# Patient Record
Sex: Female | Born: 1960 | Race: White | Hispanic: No | Marital: Married | Smoking: Never smoker
Health system: Southern US, Community
[De-identification: ages and names within clinical notes are randomized; demographics above are authoritative.]

## PROBLEM LIST (undated history)

## (undated) DIAGNOSIS — J329 Chronic sinusitis, unspecified: Secondary | ICD-10-CM

## (undated) DIAGNOSIS — K219 Gastro-esophageal reflux disease without esophagitis: Secondary | ICD-10-CM

## (undated) DIAGNOSIS — R569 Unspecified convulsions: Secondary | ICD-10-CM

## (undated) DIAGNOSIS — E559 Vitamin D deficiency, unspecified: Secondary | ICD-10-CM

## (undated) DIAGNOSIS — M5137 Other intervertebral disc degeneration, lumbosacral region: Secondary | ICD-10-CM

## (undated) DIAGNOSIS — D649 Anemia, unspecified: Secondary | ICD-10-CM

## (undated) DIAGNOSIS — I10 Essential (primary) hypertension: Secondary | ICD-10-CM

## (undated) DIAGNOSIS — N83299 Other ovarian cyst, unspecified side: Secondary | ICD-10-CM

## (undated) DIAGNOSIS — M51379 Other intervertebral disc degeneration, lumbosacral region without mention of lumbar back pain or lower extremity pain: Secondary | ICD-10-CM

## (undated) HISTORY — PX: COLONOSCOPY: SHX174

---

## 2004-09-25 ENCOUNTER — Ambulatory Visit: Payer: Self-pay

## 2005-11-07 ENCOUNTER — Ambulatory Visit: Payer: Self-pay

## 2008-08-04 ENCOUNTER — Ambulatory Visit: Payer: Self-pay

## 2010-10-06 ENCOUNTER — Ambulatory Visit: Payer: Self-pay | Admitting: Internal Medicine

## 2011-10-15 ENCOUNTER — Ambulatory Visit: Payer: Self-pay

## 2013-12-10 ENCOUNTER — Telehealth: Payer: Self-pay

## 2014-01-13 ENCOUNTER — Other Ambulatory Visit: Payer: Self-pay

## 2015-07-05 ENCOUNTER — Encounter: Payer: Self-pay | Admitting: *Deleted

## 2015-07-06 ENCOUNTER — Encounter: Payer: Self-pay | Admitting: Anesthesiology

## 2015-07-06 ENCOUNTER — Ambulatory Visit: Payer: BC Managed Care – PPO | Admitting: Anesthesiology

## 2015-07-06 ENCOUNTER — Ambulatory Visit
Admission: RE | Admit: 2015-07-06 | Discharge: 2015-07-06 | Disposition: A | Discharge status: Home / Self Care | Payer: BC Managed Care – PPO | Source: Ambulatory Visit | Attending: Unknown Physician Specialty | Admitting: Unknown Physician Specialty

## 2015-07-06 ENCOUNTER — Encounter
Admission: RE | Disposition: A | Discharge status: Home / Self Care | Payer: Self-pay | Source: Ambulatory Visit | Attending: Unknown Physician Specialty

## 2015-07-06 DIAGNOSIS — I10 Essential (primary) hypertension: Secondary | ICD-10-CM | POA: Diagnosis not present

## 2015-07-06 DIAGNOSIS — E559 Vitamin D deficiency, unspecified: Secondary | ICD-10-CM | POA: Diagnosis not present

## 2015-07-06 DIAGNOSIS — D649 Anemia, unspecified: Secondary | ICD-10-CM | POA: Diagnosis not present

## 2015-07-06 DIAGNOSIS — K640 First degree hemorrhoids: Secondary | ICD-10-CM | POA: Diagnosis not present

## 2015-07-06 DIAGNOSIS — Z79899 Other long term (current) drug therapy: Secondary | ICD-10-CM | POA: Insufficient documentation

## 2015-07-06 DIAGNOSIS — J329 Chronic sinusitis, unspecified: Secondary | ICD-10-CM | POA: Diagnosis not present

## 2015-07-06 DIAGNOSIS — Z1211 Encounter for screening for malignant neoplasm of colon: Secondary | ICD-10-CM | POA: Insufficient documentation

## 2015-07-06 DIAGNOSIS — R569 Unspecified convulsions: Secondary | ICD-10-CM | POA: Diagnosis not present

## 2015-07-06 DIAGNOSIS — K219 Gastro-esophageal reflux disease without esophagitis: Secondary | ICD-10-CM | POA: Insufficient documentation

## 2015-07-06 DIAGNOSIS — Z8371 Family history of colonic polyps: Secondary | ICD-10-CM | POA: Insufficient documentation

## 2015-07-06 DIAGNOSIS — M5137 Other intervertebral disc degeneration, lumbosacral region: Secondary | ICD-10-CM | POA: Insufficient documentation

## 2015-07-06 DIAGNOSIS — M1990 Unspecified osteoarthritis, unspecified site: Secondary | ICD-10-CM | POA: Diagnosis not present

## 2015-07-06 HISTORY — DX: Other intervertebral disc degeneration, lumbosacral region: M51.37

## 2015-07-06 HISTORY — DX: Essential (primary) hypertension: I10

## 2015-07-06 HISTORY — DX: Chronic sinusitis, unspecified: J32.9

## 2015-07-06 HISTORY — DX: Vitamin D deficiency, unspecified: E55.9

## 2015-07-06 HISTORY — DX: Other ovarian cyst, unspecified side: N83.299

## 2015-07-06 HISTORY — PX: COLONOSCOPY WITH PROPOFOL: SHX5780

## 2015-07-06 HISTORY — DX: Anemia, unspecified: D64.9

## 2015-07-06 HISTORY — DX: Unspecified convulsions: R56.9

## 2015-07-06 HISTORY — DX: Other intervertebral disc degeneration, lumbosacral region without mention of lumbar back pain or lower extremity pain: M51.379

## 2015-07-06 HISTORY — DX: Gastro-esophageal reflux disease without esophagitis: K21.9

## 2015-07-06 SURGERY — COLONOSCOPY WITH PROPOFOL
Anesthesia: General

## 2015-07-06 MED ORDER — FENTANYL CITRATE (PF) 100 MCG/2ML IJ SOLN
INTRAMUSCULAR | Status: DC | PRN
  Administered 2015-07-06: 50 ug via INTRAVENOUS

## 2015-07-06 MED ORDER — DEXAMETHASONE SODIUM PHOSPHATE 10 MG/ML IJ SOLN
INTRAMUSCULAR | Status: DC | PRN
Start: 2015-07-06 — End: 2015-07-06
  Administered 2015-07-06: 4 mg via INTRAVENOUS

## 2015-07-06 MED ORDER — SODIUM CHLORIDE 0.9 % IV SOLN: INTRAVENOUS | Status: DC

## 2015-07-06 MED ORDER — METOCLOPRAMIDE HCL 5 MG/ML IJ SOLN
INTRAMUSCULAR | Status: DC | PRN
  Administered 2015-07-06: 10 mg via INTRAVENOUS

## 2015-07-06 MED ORDER — MIDAZOLAM HCL 2 MG/2ML IJ SOLN
INTRAMUSCULAR | Status: DC | PRN
  Administered 2015-07-06: 2 mg via INTRAVENOUS

## 2015-07-06 MED ORDER — ONDANSETRON HCL 4 MG/2ML IJ SOLN
INTRAMUSCULAR | Status: DC | PRN
  Administered 2015-07-06: 4 mg via INTRAVENOUS

## 2015-07-06 MED ORDER — SODIUM CHLORIDE 0.9 % IV SOLN
INTRAVENOUS | Status: DC
  Administered 2015-07-06: 1000 mL via INTRAVENOUS

## 2015-07-06 MED ORDER — PROPOFOL INFUSION 10 MG/ML OPTIME
INTRAVENOUS | Status: DC | PRN
  Administered 2015-07-06: 120 ug/kg/min via INTRAVENOUS

## 2015-07-06 NOTE — Op Note (Signed)
[facility] Patient Name: Sierra Smith Procedure Date: 07/06/2015 9:55 AM MRN: [id number] Account #: [id number] Date of Birth: [birth year] Admit Type: Outpatient Age: 54 Room: [facility] ENDO ROOM 1 Gender: Female Note Status: Finalized Procedure:         Colonoscopy Indications:       Colon cancer screening in patient at increased risk:                     Family history of colon polyps Providers:         Scot Jun, MD Referring MD:      Daniel Nones, MD (Referring MD) Medicines:         Propofol per Anesthesia Complications:     No immediate complications. Procedure:         Pre-Anesthesia Assessment:                    - After reviewing the risks and benefits, the patient was                     deemed in satisfactory condition to undergo the procedure.                    After obtaining informed consent, the colonoscope was                     passed under direct vision. Throughout the procedure, the                     patient's blood pressure, pulse, and oxygen saturations                     were monitored continuously. The Colonoscope was                     introduced through the anus and advanced to the the cecum,                     identified by appendiceal orifice and ileocecal valve. The                     colonoscopy was performed without difficulty. The patient                     tolerated the procedure well. The quality of the bowel                     preparation was excellent. Findings:      Internal hemorrhoids were found during endoscopy. The hemorrhoids were       small, medium-sized and Grade I (internal hemorrhoids that do not       prolapse).      The exam was otherwise without abnormality. Impression:        - Internal hemorrhoids.                    - The examination was otherwise normal.                    - No specimens collected. Recommendation:    - Repeat colonoscopy in 5 years for screening purposes.         - The findings and recommendations were discussed with the  patient's family. Scot Jun, MD 07/06/2015 10:17:17 AM This report has been signed electronically. Number of Addenda: 0 Note Initiated On: 07/06/2015 9:55 AM Scope Withdrawal Time: 0 hours 9 minutes 41 seconds  Total Procedure Duration: 0 hours 14 minutes 57 seconds       [facility]

## 2015-07-06 NOTE — Transfer of Care (Signed)
Immediate Anesthesia Transfer of Care Note  Patient: Sierra Smith  Procedure(s) Performed: Procedure(s): COLONOSCOPY WITH PROPOFOL (N/A)  Patient Location: PACU  Anesthesia Type:General  Level of Consciousness: awake and sedated  Airway & Oxygen Therapy: Patient Spontanous Breathing and Patient connected to nasal cannula oxygen  Post-op Assessment: Report given to RN and Post -op Vital signs reviewed and stable  Post vital signs: Reviewed and stable  Last Vitals:  Filed Vitals:   07/06/15 0906  BP: 168/88  Pulse: 70  Temp: 36.4 C  Resp: 20    Complications: No apparent anesthesia complications

## 2015-07-06 NOTE — H&P (Signed)
   Primary Care Physician:  Lynnea Ferrier, MD Primary Gastroenterologist:  Dr. Mechele Collin  Pre-Procedure History & Physical: HPI:  Sierra Smith is a 54 y.o. female is here for an colonoscopy.   Past Medical History  Diagnosis Date  . Hypertension   . Seizures   . GERD (gastroesophageal reflux disease)   . Functional ovarian cysts   . Anemia   . Recurrent sinusitis   . Vitamin D deficiency   . DDD (degenerative disc disease), lumbosacral     Past Surgical History  Procedure Laterality Date  . Cesarean section N/A     x2  . Colonoscopy      Prior to Admission medications   Medication Sig Start Date End Date Taking? Authorizing Provider  cyclobenzaprine (FLEXERIL) 5 MG tablet Take 5 mg by mouth 3 (three) times daily as needed for muscle spasms.   Yes Historical Provider, MD  hydrochlorothiazide (HYDRODIURIL) 25 MG tablet Take 25 mg by mouth daily.   Yes Historical Provider, MD  HYDROcodone-acetaminophen (NORCO/VICODIN) 5-325 MG per tablet Take 1 tablet by mouth every 6 (six) hours as needed for moderate pain.   Yes Historical Provider, MD  losartan (COZAAR) 100 MG tablet Take 100 mg by mouth daily.   Yes Historical Provider, MD  metoprolol succinate (TOPROL-XL) 25 MG 24 hr tablet Take 25 mg by mouth daily.   Yes Historical Provider, MD  omeprazole (PRILOSEC) 20 MG capsule Take 20 mg by mouth daily.   Yes Historical Provider, MD    Allergies as of 05/17/2015  . (Not on File)    History reviewed. No pertinent family history.  Social History   Social History  . Marital Status: Married    Spouse Name: N/A  . Number of Children: N/A  . Years of Education: N/A   Occupational History  . Not on file.   Social History Main Topics  . Smoking status: Not on file  . Smokeless tobacco: Not on file  . Alcohol Use: Not on file  . Drug Use: Not on file  . Sexual Activity: Not on file   Other Topics Concern  . Not on file   Social History Narrative    Review of  Systems: See HPI, otherwise negative ROS  Physical Exam: BP 168/88 mmHg  Pulse 70  Temp(Src) 97.6 F (36.4 C) (Tympanic)  Resp 20  Ht  (1.702 m)  Wt 77.111 kg (170 lb)  BMI 26.62 kg/m2  SpO2 100% General:   Alert,  pleasant and cooperative in NAD Head:  Normocephalic and atraumatic. Neck:  Supple; no masses or thyromegaly. Lungs:  Clear throughout to auscultation.    Heart:  Regular rate and rhythm. Abdomen:  Soft, nontender and nondistended. Normal bowel sounds, without guarding, and without rebound.   Neurologic:  Alert and  oriented x4;  grossly normal neurologically.  Impression/Plan: Sierra Smith is here for an colonoscopy to be performed for screening family history of colon polyps  Risks, benefits, limitations, and alternatives regarding  colonoscopy have been reviewed with the patient.  Questions have been answered.  All parties agreeable.   Lynnae Prude, MD  07/06/2015, 9:53 AM

## 2015-07-06 NOTE — Anesthesia Postprocedure Evaluation (Signed)
  Anesthesia Post-op Note  Patient: Sierra Smith  Procedure(s) Performed: Procedure(s): COLONOSCOPY WITH PROPOFOL (N/A)  Anesthesia type:General  Patient location: PACU  Post pain: Pain level controlled  Post assessment: Post-op Vital signs reviewed, Patient's Cardiovascular Status Stable, Respiratory Function Stable, Patent Airway and No signs of Nausea or vomiting  Post vital signs: Reviewed and stable  Last Vitals:  Filed Vitals:   07/06/15 1052  BP: 129/79  Pulse: 56  Temp:   Resp: 17    Level of consciousness: awake, alert  and patient cooperative  Complications: No apparent anesthesia complications

## 2015-07-06 NOTE — Anesthesia Preprocedure Evaluation (Signed)
Anesthesia Evaluation  Patient identified by MRN, date of birth, ID band Patient awake    Reviewed: Allergy & Precautions, NPO status , Patient's Chart, lab work & pertinent test results, reviewed documented beta blocker date and time   Airway Mallampati: II  TM Distance: >3 FB     Dental  (+) Chipped   Pulmonary          Cardiovascular hypertension, Pt. on medications and Pt. on home beta blockers     Neuro/Psych Seizures -,     GI/Hepatic GERD-  ,  Endo/Other    Renal/GU      Musculoskeletal  (+) Arthritis -,   Abdominal   Peds  Hematology  (+) anemia ,   Anesthesia Other Findings   Reproductive/Obstetrics                             Anesthesia Physical Anesthesia Plan  ASA: III  Anesthesia Plan: General   Post-op Pain Management:    Induction: Intravenous  Airway Management Planned: Nasal Cannula  Additional Equipment:   Intra-op Plan:   Post-operative Plan:   Informed Consent: I have reviewed the patients History and Physical, chart, labs and discussed the procedure including the risks, benefits and alternatives for the proposed anesthesia with the patient or authorized representative who has indicated his/her understanding and acceptance.     Plan Discussed with: CRNA  Anesthesia Plan Comments:         Anesthesia Quick Evaluation

## 2015-07-07 ENCOUNTER — Encounter: Payer: Self-pay | Admitting: Unknown Physician Specialty

## 2016-08-19 ENCOUNTER — Other Ambulatory Visit: Payer: Self-pay | Admitting: Internal Medicine

## 2016-08-19 DIAGNOSIS — Z1231 Encounter for screening mammogram for malignant neoplasm of breast: Secondary | ICD-10-CM

## 2016-10-16 ENCOUNTER — Ambulatory Visit
Admission: RE | Admit: 2016-10-16 | Discharge: 2016-10-16 | Disposition: A | Discharge status: Home / Self Care | Payer: BC Managed Care – PPO | Source: Ambulatory Visit | Attending: Internal Medicine | Admitting: Internal Medicine

## 2016-10-16 DIAGNOSIS — Z1231 Encounter for screening mammogram for malignant neoplasm of breast: Secondary | ICD-10-CM | POA: Diagnosis present

## 2018-05-29 ENCOUNTER — Other Ambulatory Visit: Payer: Self-pay | Admitting: Internal Medicine

## 2018-05-29 DIAGNOSIS — Z1231 Encounter for screening mammogram for malignant neoplasm of breast: Secondary | ICD-10-CM

## 2018-06-15 ENCOUNTER — Ambulatory Visit
Admission: RE | Admit: 2018-06-15 | Discharge: 2018-06-15 | Disposition: A | Discharge status: Home / Self Care | Payer: BC Managed Care – PPO | Source: Ambulatory Visit | Attending: Internal Medicine | Admitting: Internal Medicine

## 2018-06-15 DIAGNOSIS — Z1231 Encounter for screening mammogram for malignant neoplasm of breast: Secondary | ICD-10-CM

## 2019-06-02 ENCOUNTER — Other Ambulatory Visit: Payer: Self-pay | Admitting: Internal Medicine

## 2019-06-02 DIAGNOSIS — Z1231 Encounter for screening mammogram for malignant neoplasm of breast: Secondary | ICD-10-CM

## 2019-07-08 ENCOUNTER — Ambulatory Visit
Admission: RE | Admit: 2019-07-08 | Discharge: 2019-07-08 | Disposition: A | Discharge status: Home / Self Care | Payer: BC Managed Care – PPO | Source: Ambulatory Visit | Attending: Internal Medicine | Admitting: Internal Medicine

## 2019-07-08 DIAGNOSIS — Z1231 Encounter for screening mammogram for malignant neoplasm of breast: Secondary | ICD-10-CM | POA: Diagnosis present

## 2020-06-08 IMAGING — MG DIGITAL SCREENING BILATERAL MAMMOGRAM WITH TOMO AND CAD
8 series · 8 of 24 positions shown · non-contrast
Comparison: Previous exam(s).

CLINICAL DATA: Screening.

EXAM:
DIGITAL SCREENING BILATERAL MAMMOGRAM WITH TOMO AND CAD

[R MLO synth-2D]
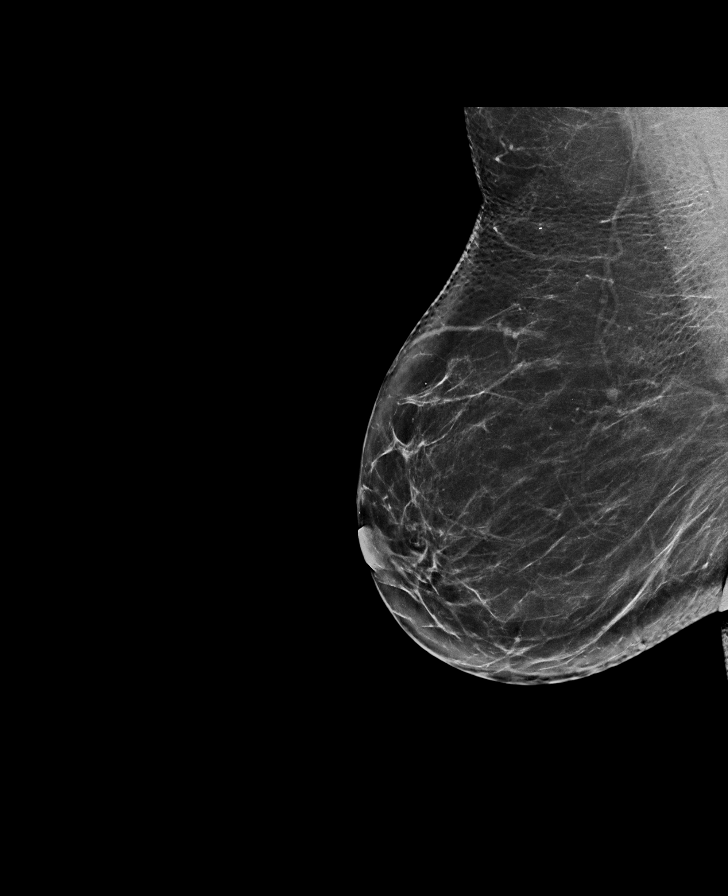

[R CC synth-2D]
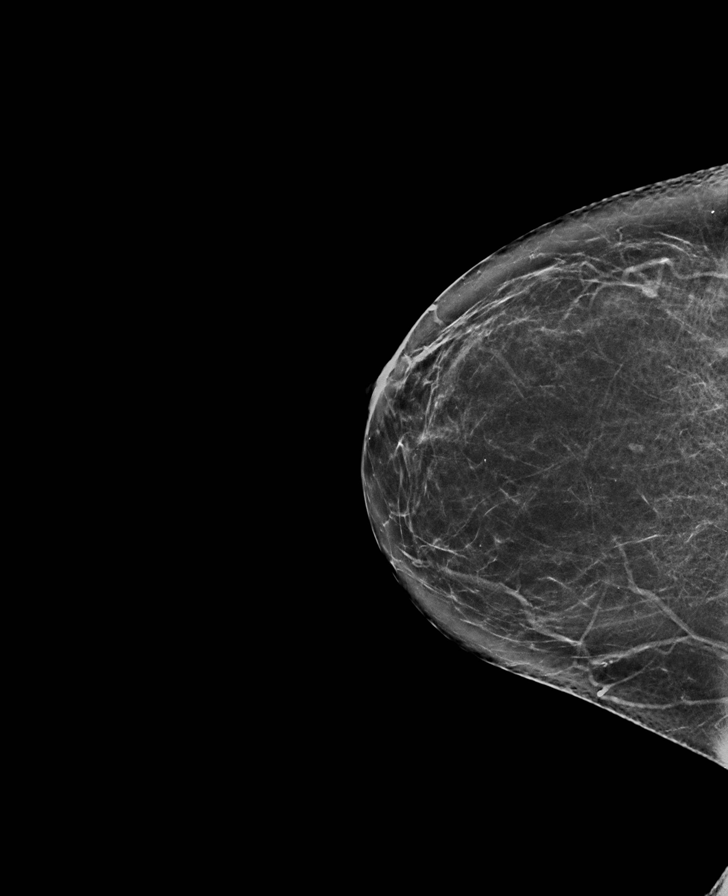

[L MLO synth-2D]
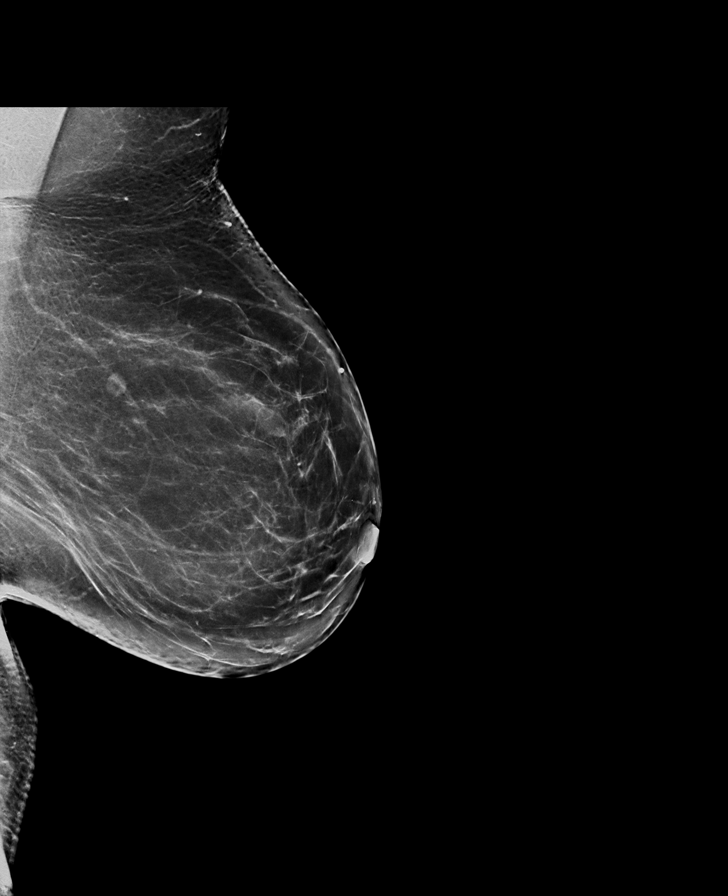

[L CC synth-2D]
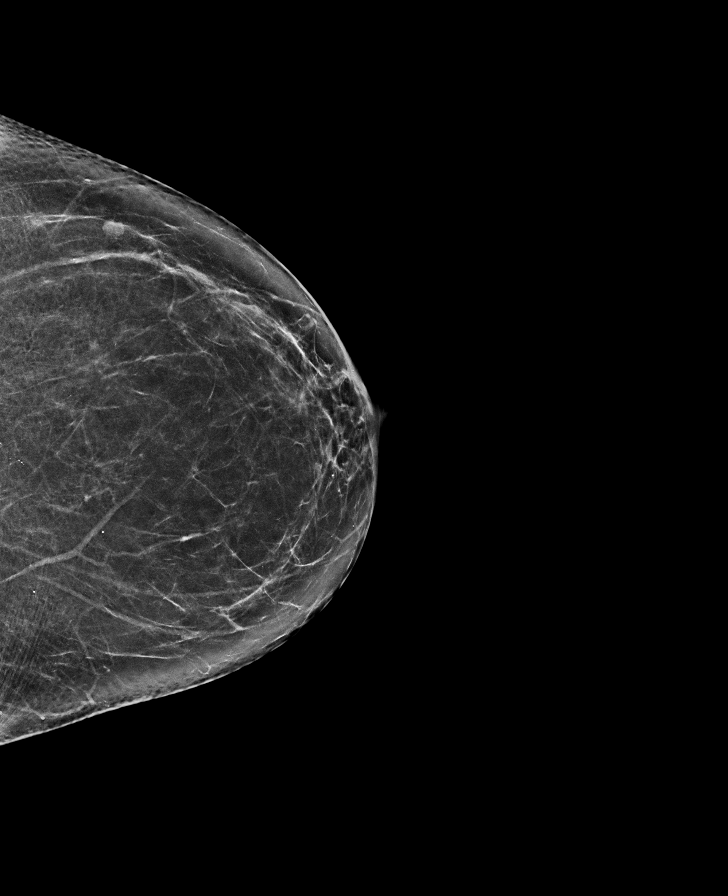

[L CC tomo · tomo slice 35/70.0]
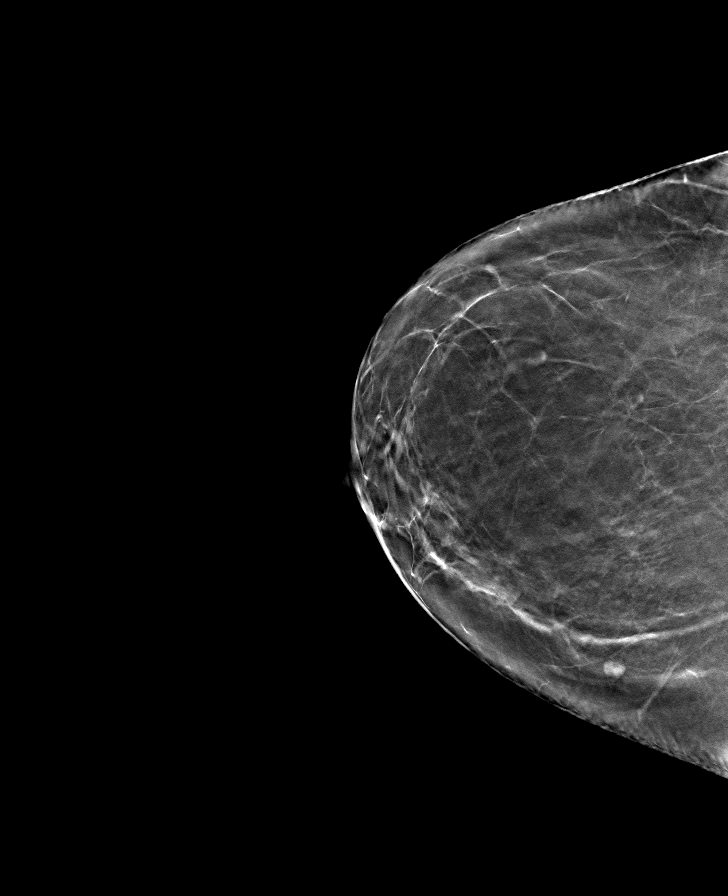

[R CC tomo · tomo slice 35/70.0]
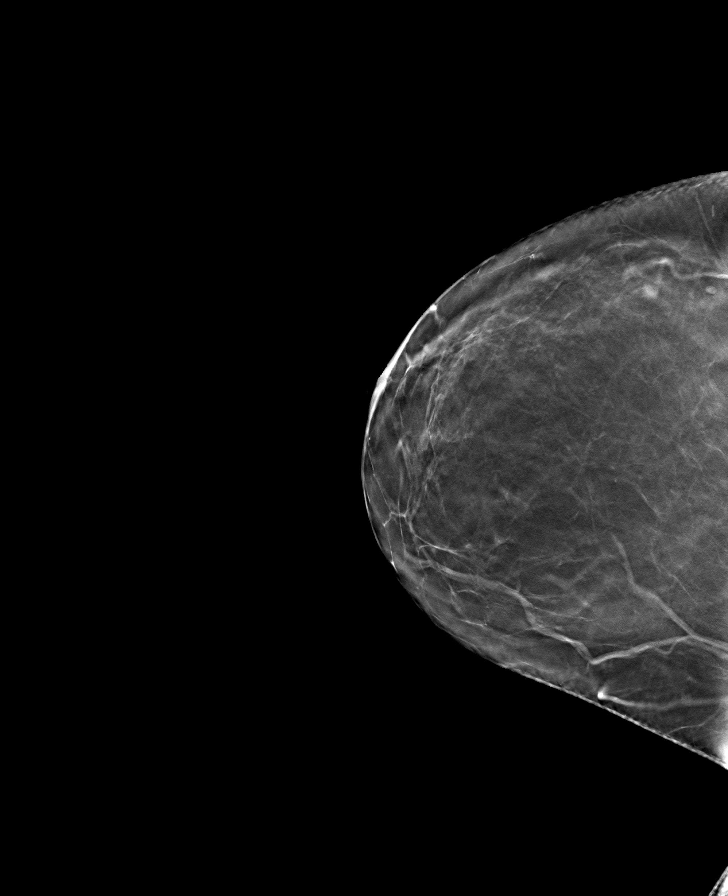

[R MLO tomo · tomo slice 40/79.0]
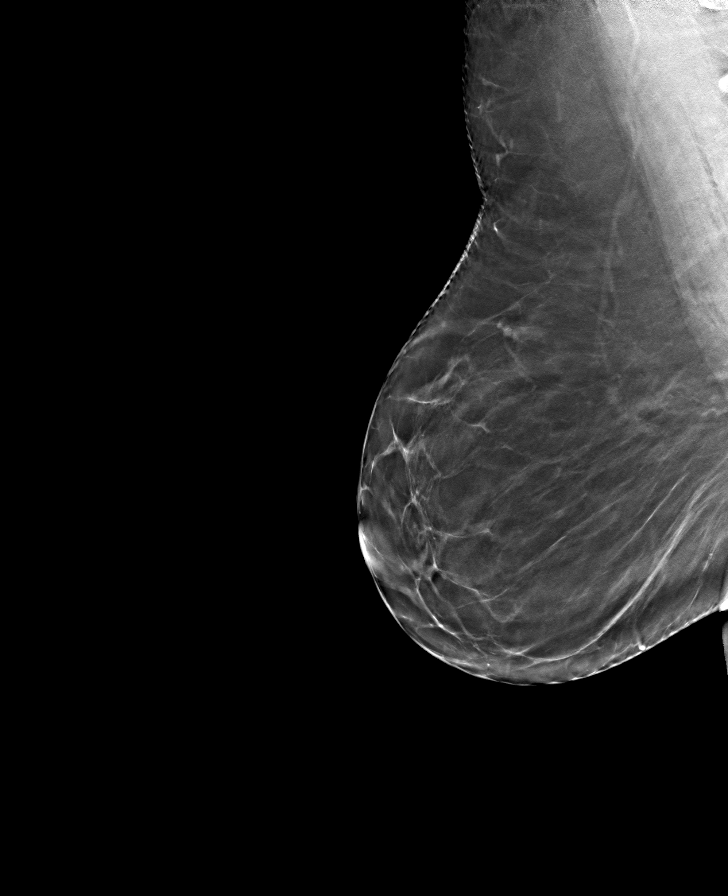

[L MLO tomo · tomo slice 46/91.0]
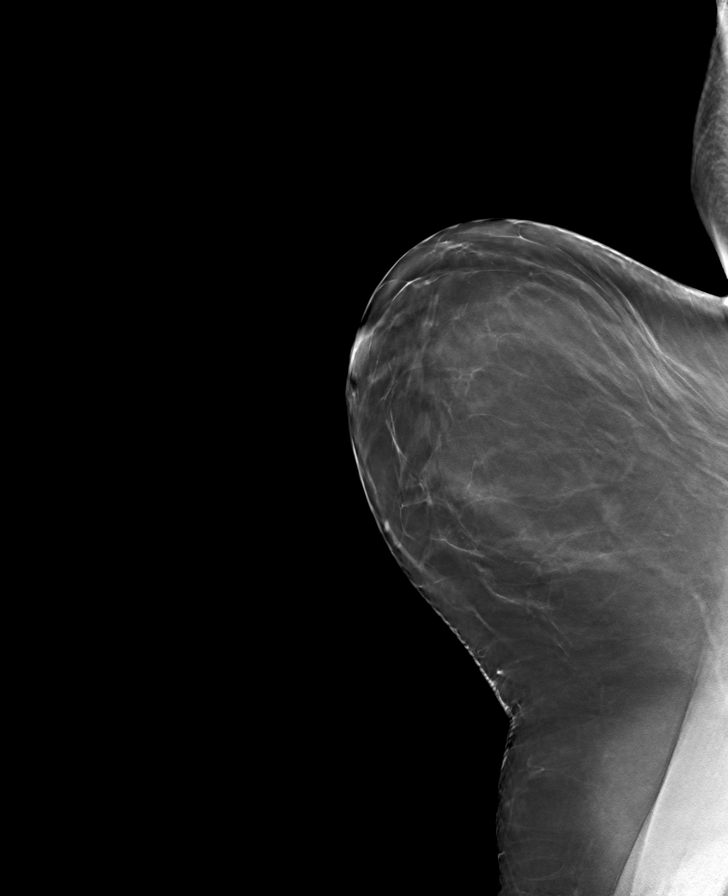

[8 of 24 positions shown; findings below may reference images not displayed]

ACR Breast Density Category b: There are scattered areas of
fibroglandular density.
FINDINGS: There are no findings suspicious for malignancy. Images were
processed with CAD.
IMPRESSION: No mammographic evidence of malignancy. A result letter of this
screening mammogram will be mailed directly to the patient.

RECOMMENDATION:
Screening mammogram in one year. (Code:[id number])

BI-RADS CATEGORY  1: Negative.

## 2020-06-28 ENCOUNTER — Other Ambulatory Visit: Payer: Self-pay | Admitting: Internal Medicine

## 2020-06-28 DIAGNOSIS — Z1231 Encounter for screening mammogram for malignant neoplasm of breast: Secondary | ICD-10-CM

## 2020-07-18 ENCOUNTER — Other Ambulatory Visit: Payer: Self-pay

## 2020-07-18 ENCOUNTER — Ambulatory Visit
Admission: RE | Admit: 2020-07-18 | Discharge: 2020-07-18 | Disposition: A | Discharge status: Home / Self Care | Payer: BC Managed Care – PPO | Source: Ambulatory Visit | Attending: Internal Medicine | Admitting: Internal Medicine

## 2020-07-18 DIAGNOSIS — Z1231 Encounter for screening mammogram for malignant neoplasm of breast: Secondary | ICD-10-CM

## 2021-06-11 ENCOUNTER — Other Ambulatory Visit: Payer: Self-pay | Admitting: Internal Medicine

## 2021-06-11 DIAGNOSIS — Z1231 Encounter for screening mammogram for malignant neoplasm of breast: Secondary | ICD-10-CM

## 2021-07-24 ENCOUNTER — Other Ambulatory Visit: Payer: Self-pay

## 2021-07-24 ENCOUNTER — Ambulatory Visit
Admission: RE | Admit: 2021-07-24 | Discharge: 2021-07-24 | Disposition: A | Discharge status: Home / Self Care | Payer: BC Managed Care – PPO | Source: Ambulatory Visit | Attending: Internal Medicine | Admitting: Internal Medicine

## 2021-07-24 DIAGNOSIS — Z1231 Encounter for screening mammogram for malignant neoplasm of breast: Secondary | ICD-10-CM | POA: Insufficient documentation

## 2021-12-20 ENCOUNTER — Encounter: Payer: Self-pay | Admitting: *Deleted

## 2021-12-21 ENCOUNTER — Encounter
Admission: RE | Disposition: A | Discharge status: Home / Self Care | Payer: Self-pay | Source: Ambulatory Visit | Attending: Gastroenterology

## 2021-12-21 ENCOUNTER — Ambulatory Visit: Payer: BC Managed Care – PPO | Admitting: Anesthesiology

## 2021-12-21 ENCOUNTER — Encounter: Payer: Self-pay | Admitting: Gastroenterology

## 2021-12-21 ENCOUNTER — Ambulatory Visit
Admission: RE | Admit: 2021-12-21 | Discharge: 2021-12-21 | Disposition: A | Discharge status: Home / Self Care | Payer: BC Managed Care – PPO | Source: Ambulatory Visit | Attending: Gastroenterology | Admitting: Gastroenterology

## 2021-12-21 DIAGNOSIS — Z1211 Encounter for screening for malignant neoplasm of colon: Secondary | ICD-10-CM | POA: Diagnosis not present

## 2021-12-21 DIAGNOSIS — Z8371 Family history of colonic polyps: Secondary | ICD-10-CM | POA: Insufficient documentation

## 2021-12-21 DIAGNOSIS — Z79899 Other long term (current) drug therapy: Secondary | ICD-10-CM | POA: Insufficient documentation

## 2021-12-21 DIAGNOSIS — K219 Gastro-esophageal reflux disease without esophagitis: Secondary | ICD-10-CM | POA: Diagnosis not present

## 2021-12-21 DIAGNOSIS — K640 First degree hemorrhoids: Secondary | ICD-10-CM | POA: Insufficient documentation

## 2021-12-21 DIAGNOSIS — I10 Essential (primary) hypertension: Secondary | ICD-10-CM | POA: Diagnosis not present

## 2021-12-21 HISTORY — PX: COLONOSCOPY WITH PROPOFOL: SHX5780

## 2021-12-21 SURGERY — COLONOSCOPY WITH PROPOFOL
Anesthesia: General

## 2021-12-21 MED ORDER — PROPOFOL 500 MG/50ML IV EMUL
INTRAVENOUS | Status: AC
  Filled 2021-12-21: qty 50

## 2021-12-21 MED ORDER — GLYCOPYRROLATE 0.2 MG/ML IJ SOLN
INTRAMUSCULAR | Status: AC
  Filled 2021-12-21: qty 1

## 2021-12-21 MED ORDER — ONDANSETRON HCL 4 MG/2ML IJ SOLN
INTRAMUSCULAR | Status: AC
  Filled 2021-12-21: qty 2

## 2021-12-21 MED ORDER — LIDOCAINE HCL (PF) 2 % IJ SOLN
INTRAMUSCULAR | Status: AC
  Filled 2021-12-21: qty 5

## 2021-12-21 MED ORDER — PROPOFOL 500 MG/50ML IV EMUL
INTRAVENOUS | Status: DC | PRN
  Administered 2021-12-21: 50 ug/kg/min via INTRAVENOUS

## 2021-12-21 MED ORDER — ONDANSETRON HCL 4 MG/2ML IJ SOLN
INTRAMUSCULAR | Status: DC | PRN
  Administered 2021-12-21: 4 mg via INTRAVENOUS

## 2021-12-21 MED ORDER — MIDAZOLAM HCL 2 MG/2ML IJ SOLN
INTRAMUSCULAR | Status: AC
  Filled 2021-12-21: qty 2

## 2021-12-21 MED ORDER — SODIUM CHLORIDE 0.9 % IV SOLN
INTRAVENOUS | Status: DC
  Administered 2021-12-21: 20 mL/h via INTRAVENOUS

## 2021-12-21 MED ORDER — FENTANYL CITRATE (PF) 100 MCG/2ML IJ SOLN
INTRAMUSCULAR | Status: AC
  Filled 2021-12-21: qty 2

## 2021-12-21 MED ORDER — MIDAZOLAM HCL 2 MG/2ML IJ SOLN
INTRAMUSCULAR | Status: DC | PRN
Start: 2021-12-21 — End: 2021-12-21
  Administered 2021-12-21: 2 mg via INTRAVENOUS

## 2021-12-21 MED ORDER — LIDOCAINE HCL (CARDIAC) PF 100 MG/5ML IV SOSY
PREFILLED_SYRINGE | INTRAVENOUS | Status: DC | PRN
  Administered 2021-12-21: 80 mg via INTRAVENOUS

## 2021-12-21 MED ORDER — FENTANYL CITRATE (PF) 100 MCG/2ML IJ SOLN
INTRAMUSCULAR | Status: DC | PRN
  Administered 2021-12-21 (×2): 50 ug via INTRAVENOUS

## 2021-12-21 MED ORDER — PROPOFOL 10 MG/ML IV BOLUS
INTRAVENOUS | Status: DC | PRN
  Administered 2021-12-21: 30 mg via INTRAVENOUS
  Administered 2021-12-21: 20 mg via INTRAVENOUS

## 2021-12-21 NOTE — Transfer of Care (Signed)
Immediate Anesthesia Transfer of Care Note  Patient: Sierra Smith  Procedure(s) Performed: COLONOSCOPY WITH PROPOFOL  Patient Location: PACU  Anesthesia Type:General  Level of Consciousness: sedated  Airway & Oxygen Therapy: Patient Spontanous Breathing and Patient connected to nasal cannula oxygen  Post-op Assessment: Report given to RN and Post -op Vital signs reviewed and stable  Post vital signs: Reviewed and stable  Last Vitals:  Vitals Value Taken Time  BP 111/59 12/21/21 1000  Temp 36.6 C 12/21/21 1000  Pulse 74 12/21/21 1000  Resp 11 12/21/21 1000  SpO2 100 % 12/21/21 1000  Vitals shown include unvalidated device data.  Last Pain:  Vitals:   12/21/21 1000  TempSrc: Tympanic  PainSc: Asleep         Complications: No notable events documented.

## 2021-12-21 NOTE — Op Note (Signed)
[facility] Patient Name: Sierra Smith Procedure Date: 12/21/2021 9:15 AM MRN: [id number] Account #: [id number] Date of Birth: [birth year] Admit Type: Outpatient Age: 61 Room: [facility] ENDO ROOM 3 Gender: Female Note Status: Finalized Instrument Name: Park Meo [id number] Procedure:             Colonoscopy Indications:           Colon cancer screening in patient at increased risk:                         Family history of 1st-degree relative with colon polyps Providers:             Andrey Farmer MD, MD Referring MD:          Ramonita Lab, MD (Referring MD) Medicines:             Monitored Anesthesia Care Complications:         No immediate complications. Procedure:             Pre-Anesthesia Assessment:                        - Prior to the procedure, a History and Physical was                         performed, and patient medications and allergies were                         reviewed. The patient is competent. The risks and                         benefits of the procedure and the sedation options and                         risks were discussed with the patient. All questions                         were answered and informed consent was obtained.                         Patient identification and proposed procedure were                         verified by the physician, the nurse, the                         anesthesiologist, the anesthetist and the technician                         in the endoscopy suite. Mental Status Examination:                         alert and oriented. Airway Examination: normal                         oropharyngeal airway and neck mobility. Respiratory                         Examination: clear to auscultation. CV Examination:  normal. Prophylactic Antibiotics: The patient does not                         require prophylactic antibiotics. Prior                         Anticoagulants: The patient has  taken no previous                         anticoagulant or antiplatelet agents. ASA Grade                         Assessment: II - A patient with mild systemic disease.                         After reviewing the risks and benefits, the patient                         was deemed in satisfactory condition to undergo the                         procedure. The anesthesia plan was to use monitored                         anesthesia care (MAC). Immediately prior to                         administration of medications, the patient was                         re-assessed for adequacy to receive sedatives. The                         heart rate, respiratory rate, oxygen saturations,                         blood pressure, adequacy of pulmonary ventilation, and                         response to care were monitored throughout the                         procedure. The physical status of the patient was                         re-assessed after the procedure.                        After obtaining informed consent, the colonoscope was                         passed under direct vision. Throughout the procedure,                         the patient's blood pressure, pulse, and oxygen                         saturations were monitored continuously. The  Colonoscope was introduced through the anus and                         advanced to the the cecum, identified by appendiceal                         orifice and ileocecal valve. The colonoscopy was                         somewhat difficult due to restricted mobility of the                         colon. The patient tolerated the procedure well. The                         quality of the bowel preparation was good. Findings:      The perianal and digital rectal examinations were normal.      Internal hemorrhoids were found during retroflexion. The hemorrhoids       were Grade I (internal hemorrhoids that do not prolapse).       The exam was otherwise without abnormality on direct and retroflexion       views. Impression:            - Internal hemorrhoids.                        - The examination was otherwise normal on direct and                         retroflexion views.                        - No specimens collected. Recommendation:        - Discharge patient to home.                        - Resume previous diet.                        - Continue present medications.                        - Repeat colonoscopy in 10 years for screening                         purposes.                        - Return to referring physician as previously                         scheduled. Procedure Code(s):     --- Professional ---                        F7510, Colorectal cancer screening; colonoscopy on                         individual at high risk Diagnosis Code(s):     --- Professional ---  Z83.71, Family history of colonic polyps                        K64.0, First degree hemorrhoids CPT copyright 2019 [employer]. All rights reserved. The codes documented in this report are preliminary and upon coder review may  be revised to meet current compliance requirements. Andrey Farmer MD, MD 12/21/2021 10:01:43 AM Number of Addenda: 0 Note Initiated On: 12/21/2021 9:15 AM Total Procedure Duration: 0 hours 18 minutes 35 seconds  Estimated Blood Loss:  Estimated blood loss: none.      [facility]

## 2021-12-21 NOTE — H&P (Signed)
Outpatient short stay form Pre-procedure 12/21/2021  Regis Bill, MD  Primary Physician: Lynnea Ferrier, MD  Reason for visit:  Screening colonoscopy in patient with family history of polyps  History of present illness:    61 y/o lady with history of hypertension and GERD here for colonoscopy. No blood thinners. No family history of GI malignancies. Mother had adenomatous polyps of unknown size at unknown age. History of c-sections and GYN surgery for history of benign tumor.    Current Facility-Administered Medications:    0.9 %  sodium chloride infusion, , Intravenous, Continuous, Maniya Donovan, Rossie Muskrat, MD, Last Rate: 20 mL/hr at 12/21/21 0838, 20 mL/hr at 12/21/21 0838  Medications Prior to Admission  Medication Sig Dispense Refill Last Dose   Cholecalciferol 1.25 MG (50000 UT) TABS Take by mouth.   Past Week   CRANBERRY CONCENTRATE PO Take 15,000 mg by mouth.   Past Week   Cranberry, Vacc oxycoccus, (CRANBERRY EXTRACT) 200 MG CAPS Take by mouth.   Past Week   cyclobenzaprine (FLEXERIL) 5 MG tablet Take by mouth 3 (three) times daily as needed for muscle spasms.   Past Week   hydrochlorothiazide (HYDRODIURIL) 25 MG tablet Take 25 mg by mouth daily.   12/20/2021   HYDROcodone-acetaminophen (NORCO/VICODIN) 5-325 MG per tablet Take 1 tablet by mouth every 6 (six) hours as needed for moderate pain.   12/20/2021   losartan (COZAAR) 100 MG tablet Take 100 mg by mouth daily.   12/20/2021   metoprolol succinate (TOPROL-XL) 25 MG 24 hr tablet Take 25 mg by mouth daily.   12/20/2021   naloxone (NARCAN) nasal spray 4 mg/0.1 mL Place 1 spray into the nose.   Past Month   Nutritional Supplements (ESTROVEN PO) Take by mouth.   Past Week   omeprazole (PRILOSEC) 20 MG capsule Take 20 mg by mouth daily.   Past Week     Allergies  Allergen Reactions   Lisinopril Cough   Lyrica [Pregabalin]    Neurontin [Gabapentin]    Tramadol     DIZZINESS, NAUSEA     Past Medical History:  Diagnosis  Date   Anemia    DDD (degenerative disc disease), lumbosacral    Functional ovarian cysts    GERD (gastroesophageal reflux disease)    Hypertension    Recurrent sinusitis    Seizures (HCC)    Vitamin D deficiency     Review of systems:  Otherwise negative.    Physical Exam  Gen: Alert, oriented. Appears stated age.  HEENT: PERRLA. Lungs: No respiratory distress CV: RRR Abd: soft, benign, no masses Ext: No edema   Planned procedures: Proceed with colonoscopy. The patient understands the nature of the planned procedure, indications, risks, alternatives and potential complications including but not limited to bleeding, infection, perforation, damage to internal organs and possible oversedation/side effects from anesthesia. The patient agrees and gives consent to proceed.  Please refer to procedure notes for findings, recommendations and patient disposition/instructions.     Regis Bill, MD [facility]

## 2021-12-21 NOTE — Anesthesia Preprocedure Evaluation (Signed)
Anesthesia Evaluation  Patient identified by MRN, date of birth, ID band Patient awake    Reviewed: Allergy & Precautions, NPO status , Patient's Chart, lab work & pertinent test results  Airway Mallampati: III  TM Distance: >3 FB Neck ROM: Full    Dental no notable dental hx.    Pulmonary neg pulmonary ROS,    Pulmonary exam normal breath sounds clear to auscultation       Cardiovascular hypertension, Pt. on medications Normal cardiovascular exam Rhythm:Regular Rate:Normal     Neuro/Psych negative neurological ROS  negative psych ROS   GI/Hepatic Neg liver ROS, GERD  Controlled and Medicated,  Endo/Other  negative endocrine ROS  Renal/GU negative Renal ROS  negative genitourinary   Musculoskeletal  (+) Arthritis ,   Abdominal Normal abdominal exam  (+)   Peds negative pediatric ROS (+)  Hematology   Anesthesia Other Findings   Reproductive/Obstetrics negative OB ROS                            Anesthesia Physical Anesthesia Plan  ASA: 2  Anesthesia Plan: General   Post-op Pain Management:    Induction: Intravenous  PONV Risk Score and Plan: Propofol infusion, Treatment may vary due to age or medical condition, TIVA, Ondansetron and Promethazine  Airway Management Planned: Natural Airway and Nasal Cannula  Additional Equipment:   Intra-op Plan:   Post-operative Plan:   Informed Consent: I have reviewed the patients History and Physical, chart, labs and discussed the procedure including the risks, benefits and alternatives for the proposed anesthesia with the patient or authorized representative who has indicated his/her understanding and acceptance.     Dental Advisory Given  Plan Discussed with: Anesthesiologist, CRNA and Surgeon  Anesthesia Plan Comments: (Patient consented for risks of anesthesia including but not limited to:  - adverse reactions to medications -  risk of airway placement if required - damage to eyes, teeth, lips or other oral mucosa - nerve damage due to positioning  - sore throat or hoarseness - Damage to heart, brain, nerves, lungs, other parts of body or loss of life  Patient voiced understanding.)        Anesthesia Quick Evaluation

## 2021-12-21 NOTE — Interval H&P Note (Signed)
History and Physical Interval Note:  12/21/2021 9:31 AM  Sierra Smith  has presented today for surgery, with the diagnosis of Z83.71 Family History of Colon Polyps.  The various methods of treatment have been discussed with the patient and family. After consideration of risks, benefits and other options for treatment, the patient has consented to  Procedure(s) with comments: COLONOSCOPY WITH PROPOFOL (N/A) - DM as a surgical intervention.  The patient's history has been reviewed, patient examined, no change in status, stable for surgery.  I have reviewed the patient's chart and labs.  Questions were answered to the patient's satisfaction.     Regis Bill  Ok to proceed with colonoscopy

## 2021-12-21 NOTE — Anesthesia Postprocedure Evaluation (Signed)
Anesthesia Post Note  Patient: Sierra Smith  Procedure(s) Performed: COLONOSCOPY WITH PROPOFOL  Patient location during evaluation: Endoscopy Anesthesia Type: General Level of consciousness: awake and alert Pain management: pain level controlled Vital Signs Assessment: post-procedure vital signs reviewed and stable Respiratory status: spontaneous breathing, nonlabored ventilation and respiratory function stable Cardiovascular status: blood pressure returned to baseline and stable Postop Assessment: no apparent nausea or vomiting Anesthetic complications: no   No notable events documented.   Last Vitals:  Vitals:   12/21/21 1020 12/21/21 1030  BP: (!) 150/67 139/80  Pulse: 74 70  Resp: 16 19  Temp:    SpO2: 99%     Last Pain:  Vitals:   12/21/21 1030  TempSrc:   PainSc: 0-No pain                 Foye Deer

## 2022-06-20 ENCOUNTER — Other Ambulatory Visit: Payer: Self-pay | Admitting: Internal Medicine

## 2022-06-20 DIAGNOSIS — Z1231 Encounter for screening mammogram for malignant neoplasm of breast: Secondary | ICD-10-CM

## 2022-07-25 ENCOUNTER — Ambulatory Visit
Admission: RE | Admit: 2022-07-25 | Discharge: 2022-07-25 | Disposition: A | Discharge status: Home / Self Care | Payer: BC Managed Care – PPO | Source: Ambulatory Visit | Attending: Internal Medicine | Admitting: Internal Medicine

## 2022-07-25 DIAGNOSIS — Z1231 Encounter for screening mammogram for malignant neoplasm of breast: Secondary | ICD-10-CM | POA: Diagnosis present

## 2023-06-23 ENCOUNTER — Other Ambulatory Visit: Payer: Self-pay | Admitting: Internal Medicine

## 2023-06-23 DIAGNOSIS — Z1231 Encounter for screening mammogram for malignant neoplasm of breast: Secondary | ICD-10-CM

## 2023-07-30 ENCOUNTER — Ambulatory Visit
Admission: RE | Admit: 2023-07-30 | Discharge: 2023-07-30 | Disposition: A | Discharge status: Home / Self Care | Payer: BC Managed Care – PPO | Source: Ambulatory Visit | Attending: Internal Medicine | Admitting: Internal Medicine

## 2023-07-30 DIAGNOSIS — Z1231 Encounter for screening mammogram for malignant neoplasm of breast: Secondary | ICD-10-CM | POA: Insufficient documentation

## 2024-06-29 ENCOUNTER — Other Ambulatory Visit: Payer: Self-pay | Admitting: Internal Medicine

## 2024-06-29 DIAGNOSIS — Z1231 Encounter for screening mammogram for malignant neoplasm of breast: Secondary | ICD-10-CM

## 2024-07-30 ENCOUNTER — Ambulatory Visit
Admission: RE | Admit: 2024-07-30 | Discharge: 2024-07-30 | Disposition: A | Discharge status: Home / Self Care | Source: Ambulatory Visit | Attending: Internal Medicine | Admitting: Internal Medicine

## 2024-07-30 DIAGNOSIS — Z1231 Encounter for screening mammogram for malignant neoplasm of breast: Secondary | ICD-10-CM | POA: Insufficient documentation
